# Patient Record
Sex: Male | Born: 1990 | Race: Black or African American | Hispanic: No | Marital: Single | Smoking: Current every day smoker
Health system: Southern US, Community
[De-identification: ages and names within clinical notes are randomized; demographics above are authoritative.]

---

## 2018-07-23 ENCOUNTER — Encounter (HOSPITAL_COMMUNITY): Payer: Self-pay | Admitting: Emergency Medicine

## 2018-07-23 ENCOUNTER — Emergency Department (HOSPITAL_COMMUNITY)
Admission: EM | Admit: 2018-07-23 | Discharge: 2018-07-23 | Disposition: A | Discharge status: Home / Self Care | Payer: Self-pay | Attending: Emergency Medicine | Admitting: Emergency Medicine

## 2018-07-23 ENCOUNTER — Emergency Department (HOSPITAL_COMMUNITY): Payer: Self-pay

## 2018-07-23 DIAGNOSIS — F17210 Nicotine dependence, cigarettes, uncomplicated: Secondary | ICD-10-CM | POA: Insufficient documentation

## 2018-07-23 DIAGNOSIS — R079 Chest pain, unspecified: Secondary | ICD-10-CM | POA: Insufficient documentation

## 2018-07-23 LAB — CBC
HCT: 38.2 % — ABNORMAL LOW (ref 39.0–52.0)
Hemoglobin: 13.1 g/dL (ref 13.0–17.0)
MCH: 30.3 pg (ref 26.0–34.0)
MCHC: 34.3 g/dL (ref 30.0–36.0)
MCV: 88.2 fL (ref 78.0–100.0)
PLATELETS: 200 10*3/uL (ref 150–400)
RBC: 4.33 MIL/uL (ref 4.22–5.81)
RDW: 12.2 % (ref 11.5–15.5)
WBC: 4.7 10*3/uL (ref 4.0–10.5)

## 2018-07-23 LAB — BASIC METABOLIC PANEL
Anion gap: 6 (ref 5–15)
BUN: 18 mg/dL (ref 6–20)
CHLORIDE: 105 mmol/L (ref 98–111)
CO2: 28 mmol/L (ref 22–32)
CREATININE: 1 mg/dL (ref 0.61–1.24)
Calcium: 9.3 mg/dL (ref 8.9–10.3)
GFR calc Af Amer: >60 mL/min (ref >60)
GFR calc non Af Amer: >60 mL/min (ref >60)
Glucose, Bld: 94 mg/dL (ref 70–99)
Potassium: 3.8 mmol/L (ref 3.5–5.1)
SODIUM: 139 mmol/L (ref 135–145)

## 2018-07-23 LAB — I-STAT TROPONIN, ED: Troponin i, poc: 0 ng/mL (ref 0.00–0.08)

## 2018-07-23 NOTE — ED Provider Notes (Signed)
[facility]-EMERGENCY DEPT Provider Note  CSN: [id number] Arrival date & time: 07/23/18  1557  History   Chief Complaint Chief Complaint  Patient presents with  . Chest Pain    HPI Todd Mcclain is a 27 y.o. male with no significant medical history who presented to the ED for chest pain x1 day. He describes left sided chest tightness that radiates to the left arm that began acutely while he was at work (~ 2 hours before arrival). Denies any precipitating event such as trauma or muscular strain that could be contributing to this. Patient states he has never experienced this before. Patient endorses SOB that occurred when this first happened, but has since resolved. Denies palpitations, leg swelling, vision changes  Patient states his grandmother possibly had a heart attack. Denies any other family history of MI, stroke or other acute cardiac issues.  History reviewed. No pertinent past medical history.  There are no active problems to display for this patient.   History reviewed. No pertinent surgical history.   Home Medications    Prior to Admission medications   Not on File    Family History No family history on file.  Social History Social History   Tobacco Use  . Smoking status: Current Every Day Smoker  . Smokeless tobacco: Never Used  Substance Use Topics  . Alcohol use: Not on file  . Drug use: Not on file     Allergies   Patient has no known allergies.   Review of Systems Review of Systems  Constitutional: Negative for fever.  HENT: Negative.   Eyes: Negative.   Respiratory: Positive for chest tightness and shortness of breath.   Cardiovascular: Negative for palpitations and leg swelling.  Gastrointestinal: Negative.   Musculoskeletal: Negative.   Skin: Negative.   Neurological: Negative for dizziness, weakness, light-headedness, numbness and headaches.  Psychiatric/Behavioral: Negative.      Physical Exam Updated Vital  Signs BP 133/84 (BP Location: Left Arm)   Pulse 62   Temp 98.4 F (36.9 C) (Oral)   Resp 18   Ht 5\' 9"  (1.753 m)   Wt 61.2 kg (135 lb)   SpO2 100%   BMI 19.94 kg/m   Physical Exam  Constitutional: Vital signs are normal. He appears well-developed and well-nourished. He is cooperative.  Eyes: Pupils are equal, round, and reactive to light. Conjunctivae, EOM and lids are normal.  Neck: Normal range of motion and full passive range of motion without pain. Neck supple. Normal carotid pulses present. Carotid bruit is not present.  Cardiovascular: Normal rate, regular rhythm, S1 normal, S2 normal, normal heart sounds, intact distal pulses and normal pulses. Exam reveals no gallop and no friction rub.  No murmur heard. Pulmonary/Chest: Effort normal and breath sounds normal.  Abdominal: Soft. Normal appearance and bowel sounds are normal. There is no tenderness.  Neurological: He is alert. He has normal strength and normal reflexes. No cranial nerve deficit or sensory deficit. He exhibits normal muscle tone. Coordination and gait normal.  Reflex Scores:      Tricep reflexes are 2+ on the right side and 2+ on the left side.      Bicep reflexes are 2+ on the right side and 2+ on the left side.      Brachioradialis reflexes are 2+ on the right side and 2+ on the left side.      Patellar reflexes are 2+ on the right side and 2+ on the left side.  Achilles reflexes are 2+ on the right side and 2+ on the left side. Skin: Skin is warm and intact. Capillary refill takes 2 to 3 seconds. He is not diaphoretic. No pallor.  Nursing note and vitals reviewed.    ED Treatments / Results  Labs (all labs ordered are listed, but only abnormal results are displayed) Labs Reviewed  CBC - Abnormal; Notable for the following components:      Result Value   HCT 38.2 (*)    All other components within normal limits  BASIC METABOLIC PANEL  I-STAT TROPONIN, ED    EKG None  Radiology Dg Chest 2  View  Result Date: 07/23/2018 CLINICAL DATA:  Shortness of breath and chest pain EXAM: CHEST - 2 VIEW COMPARISON:  None. FINDINGS: The heart size and mediastinal contours are within normal limits. Both lungs are clear. The visualized skeletal structures are unremarkable. IMPRESSION: No active cardiopulmonary disease. Electronically Signed   By: Alcide CleverMark  Lukens M.D.   On: 07/23/2018 17:09    Procedures Procedures (including critical care time)  Medications Ordered in ED Medications - No data to display   Initial Impression / Assessment and Plan / ED Course  Triage vital signs and the nursing notes have been reviewed.  Pertinent labs & imaging results that were available during care of the patient were reviewed and considered in medical decision making (see chart for details).  Patient presents in no acute distress and well appearing. He currently endorses mild residual chest pain. Heart score of 0. Physical exam, prelim labs, EKG, CXR and troponin are all very reassuring. Patient mentions increased stress at work which could possibly be contributing. No acute or emergent cardiac, pulm or neuro processes identified that warrant further evaluation today.   Clinical Course as of Jul 23 1856  Wed Jul 23, 2018  1812 EKG showed NSR. No ST elevations/depressions or signs of acute ischemia or infarct. This is reassuring in combination with negative troponin which assists in evaluating and ruling out an acute cardiac process. CXR normal. All labs normal.   [GM]    Clinical Course User Index [GM] Mortis, Sharyon MedicusGabrielle I, PA-C   Final Clinical Impressions(s) / ED Diagnoses  1. Chest Pain. Unclear etiology. Possibly anxiety/stress. Education provided on s/s that warrant return to the ED. Advised patient to follow-up and establish care with a PCP.  Dispo: Home. After thorough clinical evaluation, this patient is determined to be medically stable and can be safely discharged with the previously mentioned  treatment and/or outpatient follow-up/referral(s). At this time, there are no other apparent medical conditions that require further screening, evaluation or treatment.   Final diagnoses:  Chest pain, unspecified type    ED Discharge Orders    None        Reva BoresMortis, Gabrielle I, PA-C 07/23/18 1857    Arby BarrettePfeiffer, Marcy, MD 07/24/18 [phone]

## 2018-07-23 NOTE — ED Triage Notes (Signed)
Patient here from home with complaints of left sided chest pain while at work. States that he was relaxing when pain started. Reports tightness in chest radiating down arm.

## 2018-07-23 NOTE — Discharge Instructions (Addendum)
Your EKG and troponin (cardiac marker) are normal. This helps us evaluate if there is an emergent or acute problem with your heart. It is good thing that those are normal.  I have placed information below on where you can establish care with a PCP. I have also provided you with information on chest pain which includes symptoms on why you should return to the emergency department.

## 2019-02-16 IMAGING — DX DG CHEST 2V
2 series · 2 of 2 positions shown · non-contrast
Comparison: None.

CLINICAL DATA: Shortness of breath and chest pain

EXAM:
CHEST - 2 VIEW

[chest pa]
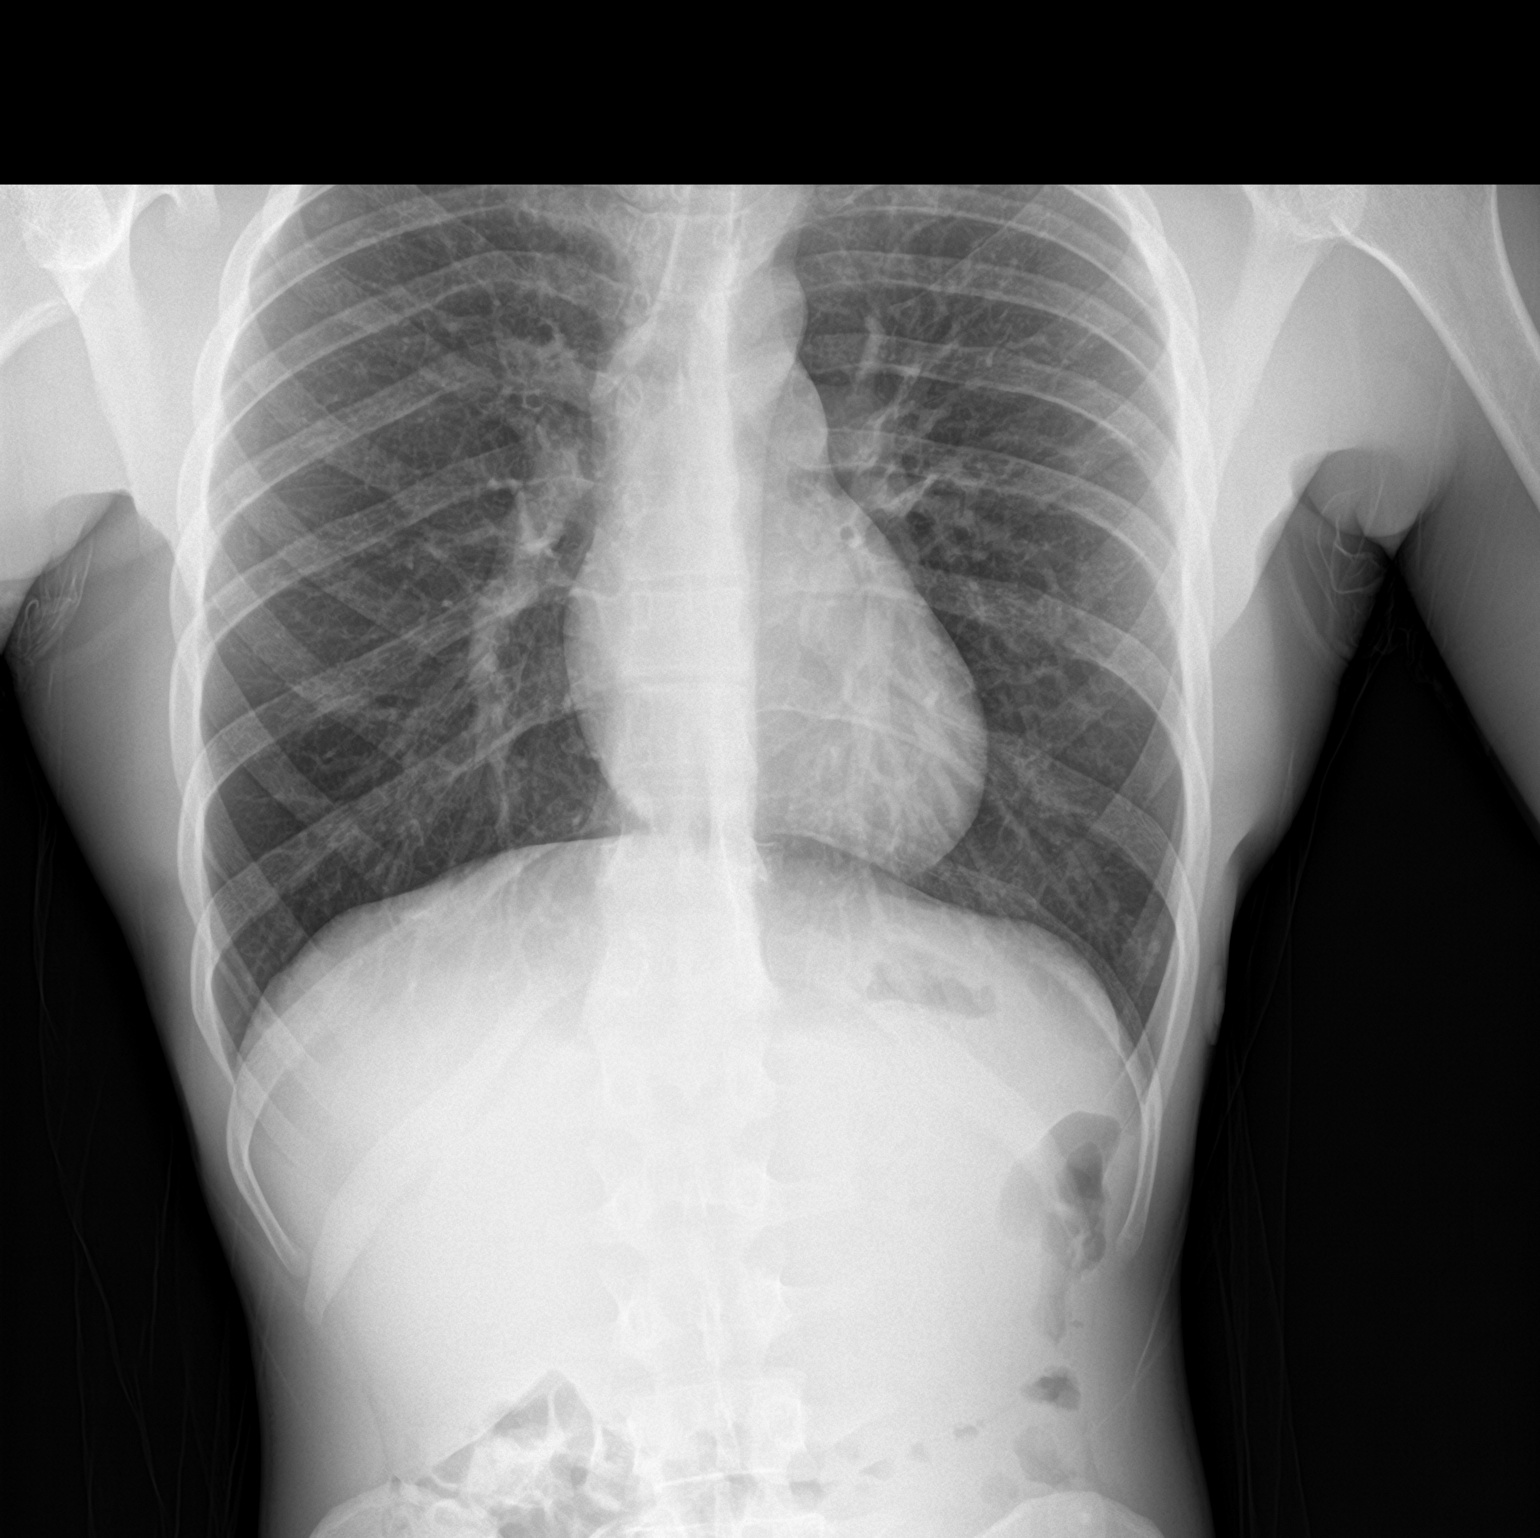

[chest lat]
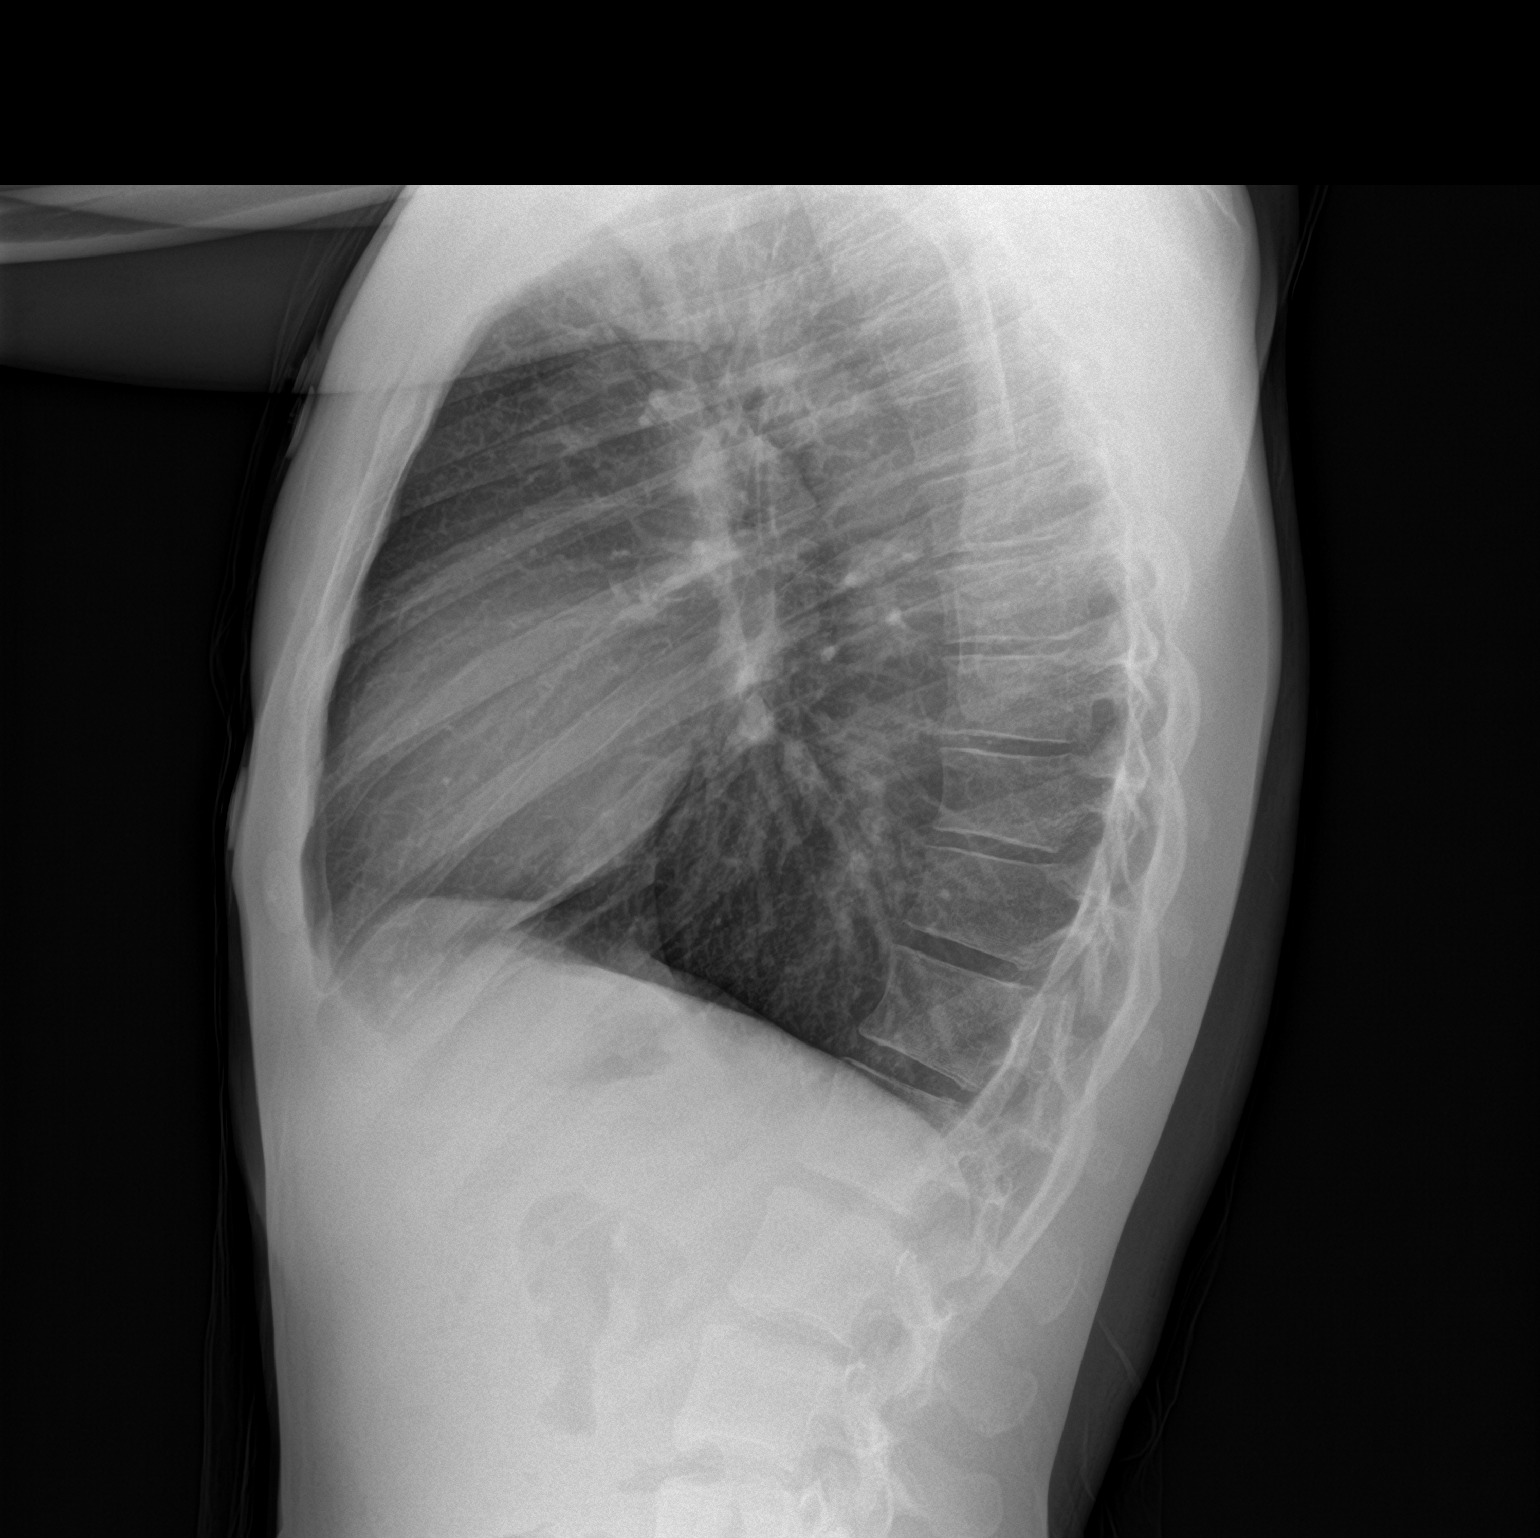

[2 of 2 positions shown; findings below may reference images not displayed]

FINDINGS: The heart size and mediastinal contours are within normal limits.
Both lungs are clear. The visualized skeletal structures are
unremarkable.
IMPRESSION: No active cardiopulmonary disease.
